# Patient Record
Sex: Male | Born: 1970 | Race: White | Hispanic: No
Health system: Southern US, Community
[De-identification: ages and names within clinical notes are randomized; demographics above are authoritative.]

## PROBLEM LIST (undated history)

## (undated) DIAGNOSIS — J45909 Unspecified asthma, uncomplicated: Secondary | ICD-10-CM

## (undated) HISTORY — PX: KNEE SURGERY: SHX244

## (undated) HISTORY — DX: Unspecified asthma, uncomplicated: J45.909

---

## 2011-10-04 ENCOUNTER — Ambulatory Visit: Payer: Self-pay | Admitting: Family Medicine

## 2011-10-06 ENCOUNTER — Other Ambulatory Visit: Payer: Self-pay | Admitting: Family Medicine

## 2011-10-17 ENCOUNTER — Other Ambulatory Visit: Payer: Self-pay

## 2012-07-04 ENCOUNTER — Ambulatory Visit: Payer: Self-pay | Admitting: Otolaryngology

## 2012-07-05 LAB — PATHOLOGY REPORT

## 2013-10-02 ENCOUNTER — Emergency Department: Payer: Self-pay | Admitting: Emergency Medicine

## 2013-10-02 LAB — COMPREHENSIVE METABOLIC PANEL
ALK PHOS: 59 U/L
ALT: 26 U/L (ref 12–78)
ANION GAP: 6 — AB (ref 7–16)
Albumin: 4.1 g/dL (ref 3.4–5.0)
BILIRUBIN TOTAL: 0.6 mg/dL (ref 0.2–1.0)
BUN: 16 mg/dL (ref 7–18)
CO2: 28 mmol/L (ref 21–32)
Calcium, Total: 8.7 mg/dL (ref 8.5–10.1)
Chloride: 103 mmol/L (ref 98–107)
Creatinine: 1.15 mg/dL (ref 0.60–1.30)
EGFR (African American): >60
EGFR (Non-African Amer.): >60
Glucose: 114 mg/dL — ABNORMAL HIGH (ref 65–99)
OSMOLALITY: 276 (ref 275–301)
Potassium: 3.8 mmol/L (ref 3.5–5.1)
SGOT(AST): 17 U/L (ref 15–37)
Sodium: 137 mmol/L (ref 136–145)
Total Protein: 7.8 g/dL (ref 6.4–8.2)

## 2013-10-02 LAB — LIPASE, BLOOD: LIPASE: 139 U/L (ref 73–393)

## 2013-10-02 LAB — CBC
HCT: 42.3 % (ref 40.0–52.0)
HGB: 14.1 g/dL (ref 13.0–18.0)
MCH: 28.9 pg (ref 26.0–34.0)
MCHC: 33.4 g/dL (ref 32.0–36.0)
MCV: 87 fL (ref 80–100)
PLATELETS: 243 10*3/uL (ref 150–440)
RBC: 4.88 10*6/uL (ref 4.40–5.90)
RDW: 13.3 % (ref 11.5–14.5)
WBC: 14.9 10*3/uL — ABNORMAL HIGH (ref 3.8–10.6)

## 2014-08-07 ENCOUNTER — Ambulatory Visit: Payer: Self-pay | Admitting: Sports Medicine

## 2014-10-02 NOTE — Op Note (Signed)
PATIENT NAME:  Isaac Edwards, Isaac Edwards MR#:  [id number] DATE OF BIRTH:  05-22-1971  DATE OF PROCEDURE:  07/04/2012  SURGEON:  Zackery BarefootJ. Madison Deniesha Stenglein, M.D.  PREOPERATIVE DIAGNOSIS:  Chronic polypoid pansinusitis.   POSTOPERATIVE DIAGNOSIS:  Chronic polypoid pansinusitis.   PROCEDURES: 1.  Image-guided sinus surgery ([employer] navigation).  2.  Bilateral frontal sinusotomy with tissue removal.  3.  Bilateral sphenoidectomy with tissue removal.  4.  Bilateral anterior and posterior ethmoidectomy with tissue removal.  5.  Bilateral maxillary sinus antrostomy with tissue removal  ANESTHESIA: General endotracheal.   DESCRIPTION OF PROCEDURE:  The image-guided sinus surgery system mask was attached and registration was carried out in the standard fashion using the appropriate fiduciary points. Calibration of the system was confirmed and extensive review preoperatively and intraoperatively was carried out.   Attention was directed to the sinus surgery portion of the surgery. The left side was addressed first. The middle turbinate was taken down subtotally with a through-cutting forceps. The uncinate process was then identified and completely resected revealing the natural maxillary sinus ostium. The natural maxillary sinus ostium was progressively enlarged by removing tissue to create a large maxillary antrostomy, removing diseased tissue. The maxillary antrum was then irrigated copiously with saline. Attention was directed to the ethmoid sinuses where bone and mucosal tissue from the ethmoid sinus was taken down from anterior to posterior preserving the skull base and lamina papyracea, removing diseased tissue. After the ethmoid sinuses had been completely cleaned out of polypoid chronically inflamed mucosa preserving the mucosa on the medial side of the middle turbinate along the skull base and along the lamina papyracea, attention was directed to the sphenoid recess. The sphenoid sinus ostium was identified and  progressively enlarged with Kerrison Rongeur, staying inferior and medial in orientation, to create a large sphenoid sinus antrostomy, removing diseased tissue. The 30 degree scope was then used to explore the frontal recess. The frontal recess was progressively enlarged meticulously, removing diseased tissue, and the frontal sinus was then copiously irrigated with saline as was the sphenoid sinus. Attention was then directed to the right side where an identical series of procedures was accomplished. Upon completion of the sinus surgery, the Surgiflo was placed. A total of 1 unit of Surgiflo was used.  A total amount of local used 8 cc.  Temporary Telfa pledgets were placed and tied over the columella to prevent dislodging.   The patient was then returned to anesthesia, allowed to emerge from anesthesia in the operating room, extubated, and taken to the recovery room in stable condition.  FINDINGS:  The paranasal sinuses were filled with polyps bilaterally.  There was purulence at the floor of the sphenoid sinus on both sides.     ____________________________ Shela CommonsJ. Gertie BaronMadison Myrta Mercer, MD [email or web address] D: 07/04/2012 20:12:24 ET T: 07/04/2012 23:01:41 ET JOB#: [id number]  cc: Zackery BarefootJ. Madison Tivon Lemoine, MD, <Dictator> Wendee CoppJMADISON Ruari Mudgett MD ELECTRONICALLY SIGNED 07/23/2012 19:29

## 2015-12-27 ENCOUNTER — Encounter: Payer: Self-pay | Admitting: Family Medicine

## 2015-12-27 ENCOUNTER — Ambulatory Visit (INDEPENDENT_AMBULATORY_CARE_PROVIDER_SITE_OTHER): Payer: Self-pay | Admitting: Family Medicine

## 2015-12-27 VITALS — BP 118/79 | HR 60 | Resp 16

## 2015-12-27 DIAGNOSIS — M62830 Muscle spasm of back: Secondary | ICD-10-CM

## 2015-12-27 MED ORDER — NAPROXEN 500 MG PO TABS: 500.0000 mg | ORAL_TABLET | Freq: Two times a day (BID) | ORAL | Status: AC

## 2015-12-27 MED ORDER — CYCLOBENZAPRINE HCL 5 MG PO TABS: 5.0000 mg | ORAL_TABLET | Freq: Two times a day (BID) | ORAL | Status: AC | PRN

## 2015-12-27 NOTE — Progress Notes (Signed)
Patient ID: Isaac Edwards, male   DOB: 04/19/1971, 45 y.o.   MRN: [id number] Patient presents today with symptoms of right upper trap pain. He states that he's had this pain for the last few days. He admits that he has been doing some physical activity outdoors and also sitting at a seminar which he feels brought his symptoms on. He has taken Tylenol occasionally for his symptoms so far. He denies any radicular symptoms. He does state that when he flexes his neck he does have discomfort in the area. He denies any trauma to the area.  ROS: Negative except mentioned above. Vitals as per Epic  GENERAL: NAD RESP: CTA B CARD: RRR MSK: Poor posture, no midline tenderness of back, mild swelling and tenderness of right trap area on palpation, full range of motion of upper extremities, full range of motion of neck however discomfort in the right trap area with flexion of the neck, nv intact NEURO: CN II-XII grossly intact   A/P: Right Upper Trap. Muscle Strain/Spasm- will treat with Naprosyn when necessary, Flexeril when necessary, Ibuprofen 800 mg given the patient prior to leaving the office, ice/heat when necessary, range of motion of neck encouraged, postural changes encouraged, seek medical attention if symptoms persist or worsen as discussed.

## 2016-08-27 IMAGING — MR MRI OF THE RIGHT KNEE WITHOUT CONTRAST
6 series · 37 of 40 positions shown · non-contrast
Comparison: Plain films the right knee 08/07/2014

CLINICAL DATA: Chronic right knee pain medially with swelling.
History of prior arthroscopic surgery x2.

EXAM:
MRI OF THE RIGHT KNEE WITHOUT CONTRAST
TECHNIQUE: Multiplanar, multisequence MR imaging of the knee was performed. No
intravenous contrast was administered.

[Series 8: PD fat-sat · axial · 3.0mm · 0.31mm/px · z∈[-68,+51]mm · 9 of 37 slices shown (1 of 4)]
[im 1/37]
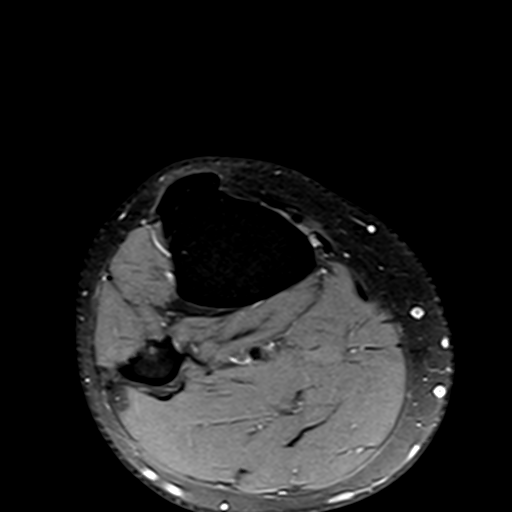
[im 5/37]
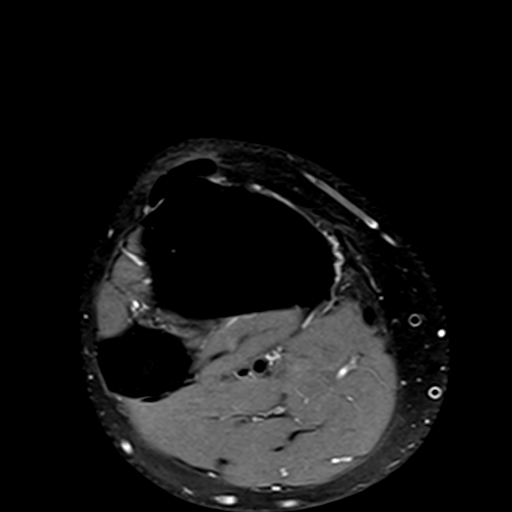
[im 10/37]
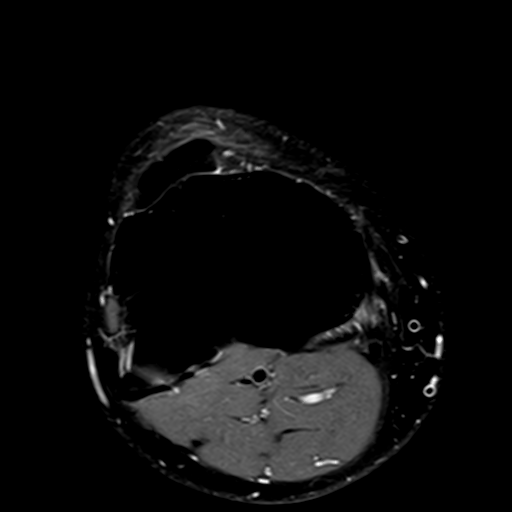
[im 14/37]
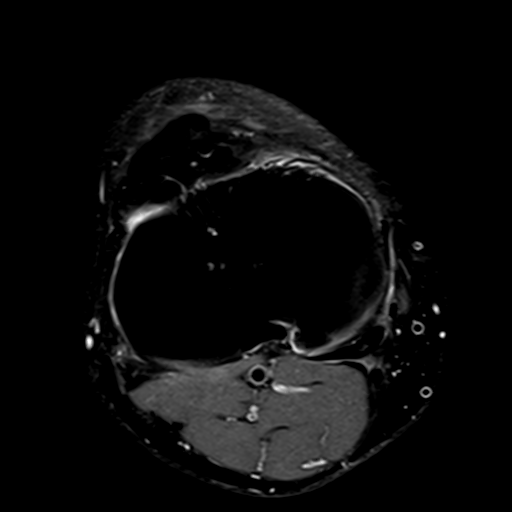
[im 19/37]
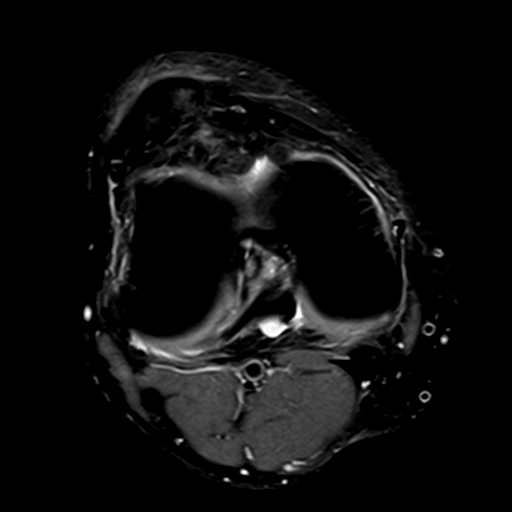
[im 23/37]
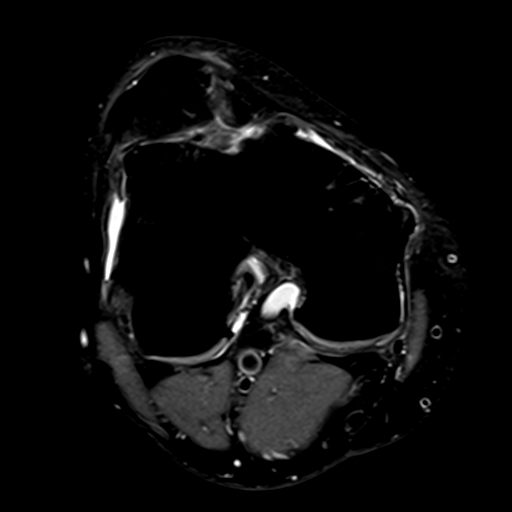
[im 28/37]
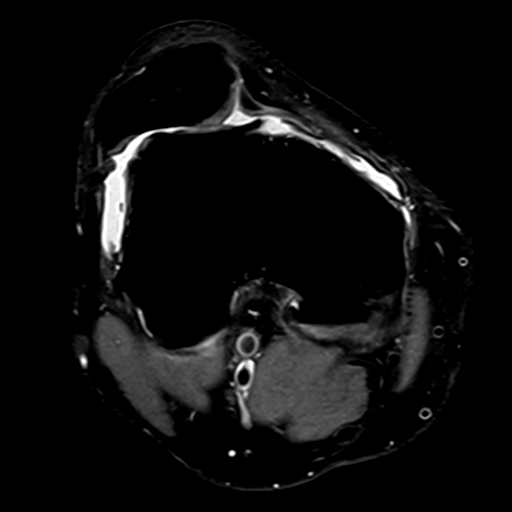
[im 32/37]
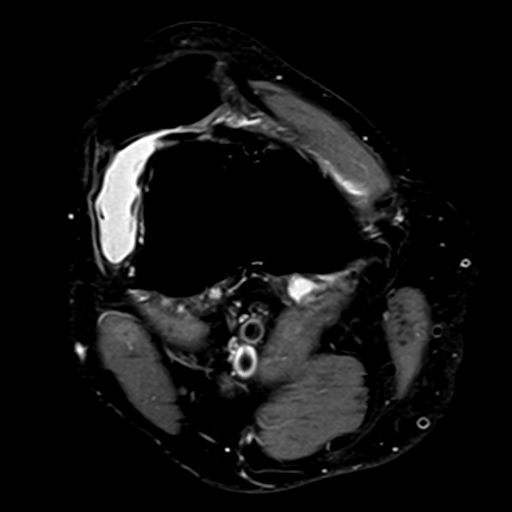
[im 37/37]
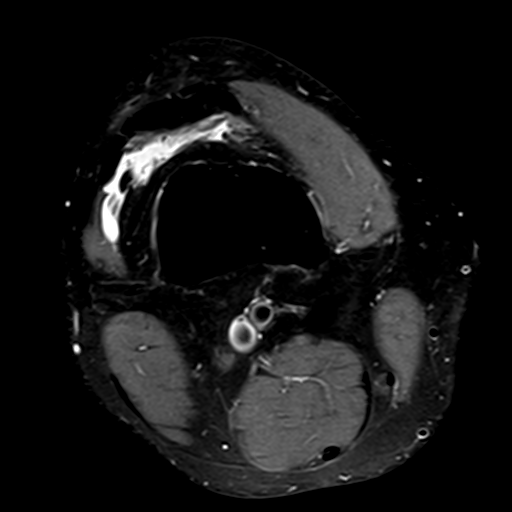

[Series 9: T1 · coronal · 3.0mm · 0.50mm/px · 5 of 35 slices shown]
[im 1/35]
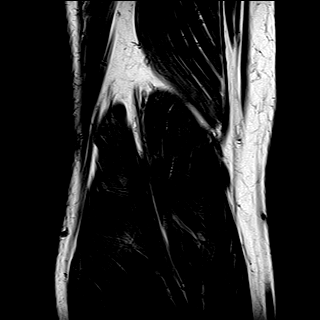
[im 5/35]
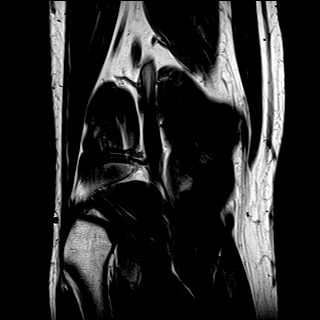
[im 10/35]
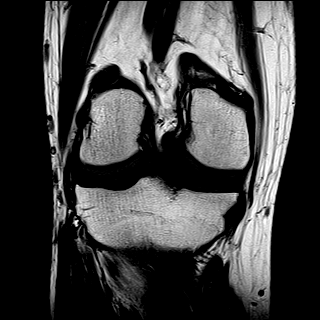
[im 15/35]
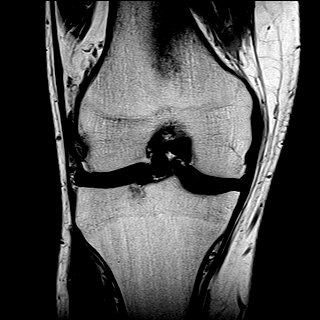
[im 20/35]
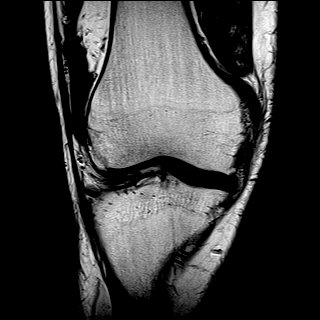

[Series 10: PD fat-sat · sagittal · 3.0mm · 0.62mm/px · 7 of 34 slices shown (2 of 4)]
[im 1/34]
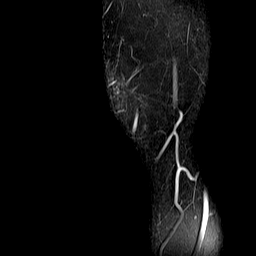
[im 6/34]
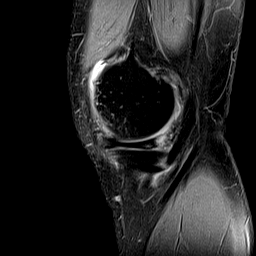
[im 12/34]
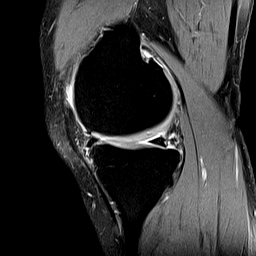
[im 17/34]
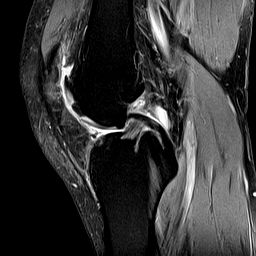
[im 23/34]
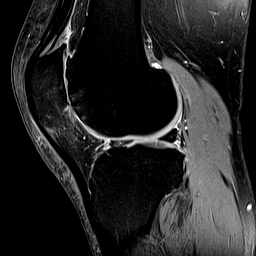
[im 28/34]
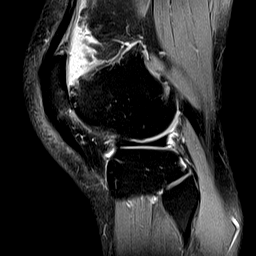
[im 34/34]
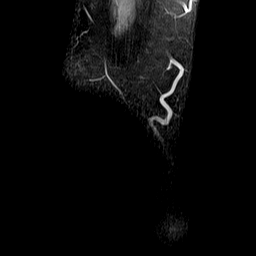

[Series 11: T2 fat-sat · coronal · 3.0mm · 0.50mm/px · 7 of 35 slices shown]
[im 1/35]
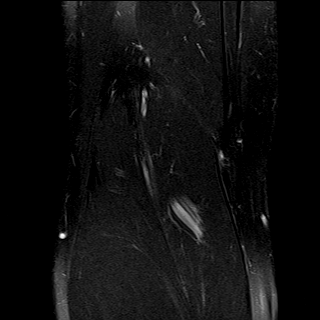
[im 6/35]
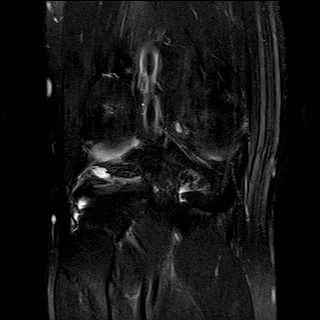
[im 12/35]
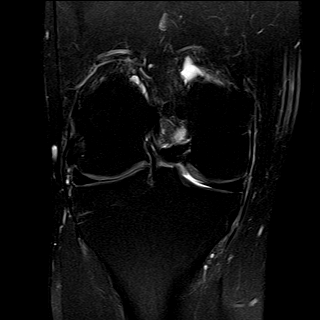
[im 18/35]
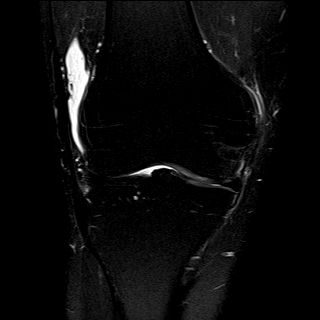
[im 23/35]
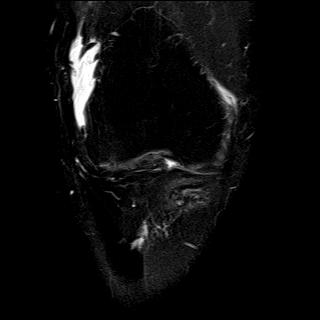
[im 29/35]
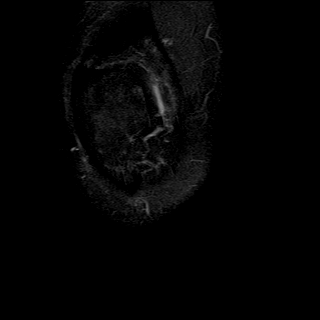
[im 35/35]
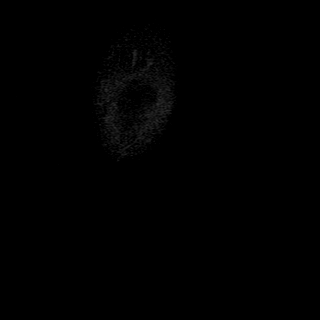

[Series 12: PD fat-sat · coronal · 3.0mm · 0.62mm/px · 7 of 35 slices shown (3 of 4)]
[im 1/35]
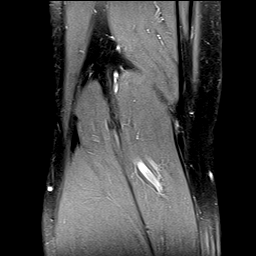
[im 6/35]
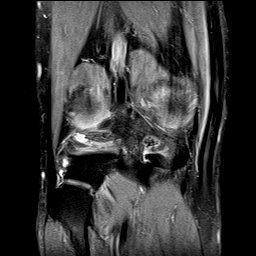
[im 12/35]
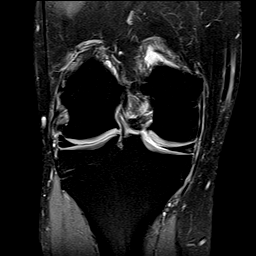
[im 18/35]
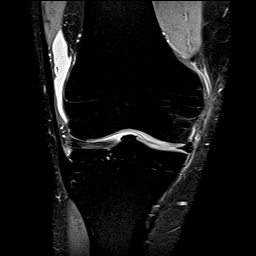
[im 23/35]
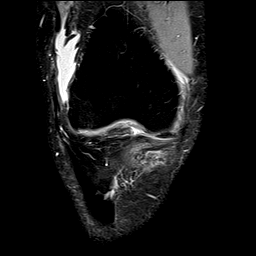
[im 29/35]
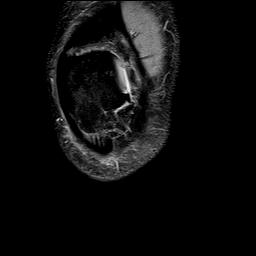
[im 35/35]
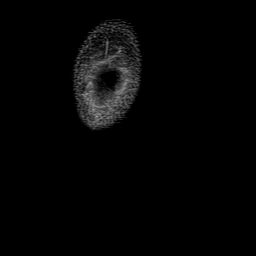

[Series 13: PD fat-sat · coronal · 2.0mm · 0.62mm/px · 2 of 11 slices shown (4 of 4)]
[im 1/11]
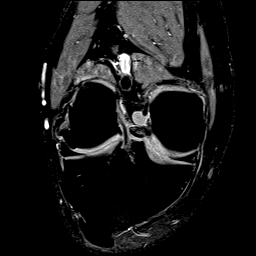
[im 11/11]
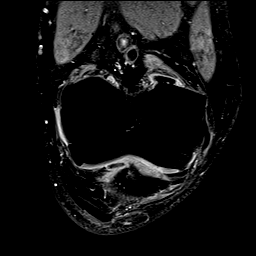

[37 of 40 positions shown; findings below may reference images not displayed]

FINDINGS: MENISCI

Medial meniscus: Scattered degenerative signal is noted. The
meniscus is intact.

Lateral meniscus: Mild degenerative signal is seen. The meniscus is
intact.

LIGAMENTS

Cruciates:  Intact.

Collaterals:  Intact.

CARTILAGE

Patellofemoral: There is marked hyaline cartilage loss about the
patellofemoral compartment worst along the lateral femoral trochlea
and lateral patellar facet. The patella rides on the lateral femoral
trochlea.

Medial:  Minimally degenerated.

Lateral:  Minimally degenerated.

Joint: There is a small joint effusion. A loose body measuring
cm is seen posterior to the root of the posterior horn of the medial
meniscus.

Popliteal Fossa:  Unremarkable.

Extensor Mechanism: Intact. Mildly increased T2 signal is seen in
the superior fibers of the patellar tendon compatible tendinosis.

Bones: Osteophytes are seen about the knee, most prominent about the
patellofemoral and lateral compartments. No fracture, stress change
or worrisome marrow lesion is identified.
IMPRESSION: Dominant finding is advanced for age patellofemoral osteoarthritis.
The medial and lateral compartments are spared. 0.8 cm loose body
posterior to the posterior horn of the medial meniscus is noted.

Negative for meniscal or ligament tear.

## 2016-10-12 ENCOUNTER — Ambulatory Visit: Admit: 2016-10-12 | Payer: Self-pay | Admitting: Gastroenterology

## 2016-10-12 SURGERY — COLONOSCOPY WITH PROPOFOL
Anesthesia: General
